# Patient Record
Sex: Male | Born: 1965 | Race: Black or African American | Hispanic: No | Marital: Single
Health system: Southern US, Community
[De-identification: ages and names within clinical notes are randomized; demographics above are authoritative.]

---

## 2009-01-23 ENCOUNTER — Ambulatory Visit: Payer: Self-pay | Admitting: Internal Medicine

## 2009-03-25 ENCOUNTER — Ambulatory Visit: Payer: Self-pay | Admitting: Internal Medicine

## 2009-03-25 ENCOUNTER — Ambulatory Visit (HOSPITAL_COMMUNITY): Admission: RE | Admit: 2009-03-25 | Discharge: 2009-03-25 | Payer: Self-pay | Admitting: Family Medicine

## 2009-04-23 ENCOUNTER — Encounter: Payer: Self-pay | Admitting: Family Medicine

## 2009-04-23 ENCOUNTER — Ambulatory Visit: Payer: Self-pay | Admitting: Internal Medicine

## 2009-04-23 LAB — CONVERTED CEMR LAB
ALT: 13 units/L (ref 0–53)
Alkaline Phosphatase: 93 units/L (ref 39–117)
Barbiturate Quant, Ur: NEGATIVE
Basophils Relative: 0 % (ref 0–1)
CO2: 23 meq/L (ref 19–32)
Calcium: 9.4 mg/dL (ref 8.4–10.5)
Creatinine, Ser: 1.05 mg/dL (ref 0.40–1.50)
Creatinine,U: 110.3 mg/dL
Glucose, Bld: 74 mg/dL (ref 70–99)
HCT: 47.6 % (ref 39.0–52.0)
Hemoglobin: 16 g/dL (ref 13.0–17.0)
Lymphs Abs: 2.2 10*3/uL (ref 0.7–4.0)
Monocytes Relative: 7 % (ref 3–12)
Neutro Abs: 3.4 10*3/uL (ref 1.7–7.7)
Opiate Screen, Urine: NEGATIVE
Platelets: 197 10*3/uL (ref 150–400)
Potassium: 5.2 meq/L (ref 3.5–5.3)
Propoxyphene: NEGATIVE
Sodium: 137 meq/L (ref 135–145)
Total Bilirubin: 0.7 mg/dL (ref 0.3–1.2)
Total CHOL/HDL Ratio: 3.1
Total Protein: 6.9 g/dL (ref 6.0–8.3)
Triglycerides: 49 mg/dL (ref <150)
WBC: 6.1 10*3/uL (ref 4.0–10.5)

## 2009-05-06 ENCOUNTER — Encounter (INDEPENDENT_AMBULATORY_CARE_PROVIDER_SITE_OTHER): Payer: Self-pay | Admitting: *Deleted

## 2009-05-20 ENCOUNTER — Ambulatory Visit: Payer: Self-pay | Admitting: Internal Medicine

## 2009-05-27 ENCOUNTER — Ambulatory Visit: Payer: Self-pay | Admitting: Internal Medicine

## 2009-11-25 ENCOUNTER — Emergency Department (HOSPITAL_COMMUNITY): Admission: EM | Admit: 2009-11-25 | Discharge: 2009-11-25 | Payer: Self-pay | Admitting: Emergency Medicine

## 2010-07-29 ENCOUNTER — Encounter (INDEPENDENT_AMBULATORY_CARE_PROVIDER_SITE_OTHER): Payer: Self-pay | Admitting: *Deleted

## 2010-10-26 LAB — BASIC METABOLIC PANEL
BUN: 11 mg/dL (ref 6–23)
CO2: 22 mEq/L (ref 19–32)
Calcium: 8.5 mg/dL (ref 8.4–10.5)
Creatinine, Ser: 1.06 mg/dL (ref 0.4–1.5)
GFR calc Af Amer: >60 mL/min (ref >60)
Glucose, Bld: 89 mg/dL (ref 70–99)
Potassium: 3.9 mEq/L (ref 3.5–5.1)
Sodium: 137 mEq/L (ref 135–145)

## 2010-10-26 LAB — CBC
Hemoglobin: 15.4 g/dL (ref 13.0–17.0)
MCHC: 34.6 g/dL (ref 30.0–36.0)
Platelets: 169 10*3/uL (ref 150–400)
RDW: 13.8 % (ref 11.5–15.5)
WBC: 9 10*3/uL (ref 4.0–10.5)

## 2010-10-26 LAB — URINALYSIS, ROUTINE W REFLEX MICROSCOPIC
Bilirubin Urine: NEGATIVE
Hgb urine dipstick: NEGATIVE
Protein, ur: NEGATIVE mg/dL

## 2010-10-26 LAB — DIFFERENTIAL
Basophils Absolute: 0.1 10*3/uL (ref 0.0–0.1)
Basophils Relative: 1 % (ref 0–1)
Eosinophils Absolute: 0.1 10*3/uL (ref 0.0–0.7)
Lymphocytes Relative: 15 % (ref 12–46)
Monocytes Relative: 7 % (ref 3–12)

## 2010-10-27 ENCOUNTER — Emergency Department (HOSPITAL_COMMUNITY): Payer: No Typology Code available for payment source

## 2010-10-27 ENCOUNTER — Emergency Department (HOSPITAL_COMMUNITY)
Admission: EM | Admit: 2010-10-27 | Discharge: 2010-10-27 | Disposition: A | Discharge status: Home / Self Care | Payer: No Typology Code available for payment source | Attending: Emergency Medicine | Admitting: Emergency Medicine

## 2010-10-27 DIAGNOSIS — S139XXA Sprain of joints and ligaments of unspecified parts of neck, initial encounter: Secondary | ICD-10-CM | POA: Insufficient documentation

## 2010-10-27 DIAGNOSIS — M542 Cervicalgia: Secondary | ICD-10-CM | POA: Insufficient documentation

## 2010-12-29 ENCOUNTER — Emergency Department (HOSPITAL_COMMUNITY)
Admission: EM | Admit: 2010-12-29 | Discharge: 2010-12-29 | Discharge status: Against Medical Advice | Payer: No Typology Code available for payment source | Attending: Emergency Medicine | Admitting: Emergency Medicine

## 2010-12-29 DIAGNOSIS — M549 Dorsalgia, unspecified: Secondary | ICD-10-CM | POA: Insufficient documentation

## 2010-12-30 ENCOUNTER — Emergency Department (HOSPITAL_COMMUNITY)
Admission: EM | Admit: 2010-12-30 | Discharge: 2010-12-30 | Disposition: A | Discharge status: Home / Self Care | Payer: No Typology Code available for payment source | Attending: Emergency Medicine | Admitting: Emergency Medicine

## 2010-12-30 DIAGNOSIS — M546 Pain in thoracic spine: Secondary | ICD-10-CM | POA: Insufficient documentation

## 2011-02-09 ENCOUNTER — Inpatient Hospital Stay (INDEPENDENT_AMBULATORY_CARE_PROVIDER_SITE_OTHER)
Admission: RE | Admit: 2011-02-09 | Discharge: 2011-02-09 | Disposition: A | Discharge status: Home / Self Care | Payer: No Typology Code available for payment source | Source: Ambulatory Visit | Attending: Emergency Medicine | Admitting: Emergency Medicine

## 2011-02-09 DIAGNOSIS — S058X9A Other injuries of unspecified eye and orbit, initial encounter: Secondary | ICD-10-CM

## 2011-04-25 ENCOUNTER — Ambulatory Visit (INDEPENDENT_AMBULATORY_CARE_PROVIDER_SITE_OTHER): Payer: No Typology Code available for payment source

## 2011-04-25 ENCOUNTER — Inpatient Hospital Stay (INDEPENDENT_AMBULATORY_CARE_PROVIDER_SITE_OTHER)
Admission: RE | Admit: 2011-04-25 | Discharge: 2011-04-25 | Disposition: A | Discharge status: Home / Self Care | Payer: BC Managed Care – PPO | Source: Ambulatory Visit | Attending: Emergency Medicine | Admitting: Emergency Medicine

## 2011-04-25 DIAGNOSIS — S90129A Contusion of unspecified lesser toe(s) without damage to nail, initial encounter: Secondary | ICD-10-CM
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
## 2016-03-30 DIAGNOSIS — F32A Depression, unspecified: Secondary | ICD-10-CM

## 2016-03-30 DIAGNOSIS — F329 Major depressive disorder, single episode, unspecified: Secondary | ICD-10-CM

## 2016-04-04 NOTE — Congregational Nurse Program (Signed)
Congregational Nurse Program Note  Date of Encounter: 03/30/2016  Past Medical History: No past medical history on file.  Encounter Details:     CNP Questionnaire - 03/30/16 1513      Patient Demographics   Is this a new or existing patient? New   Patient is considered a/an Not Applicable   Race African-American/Black     Patient Assistance   Location of Patient Assistance Not Applicable   Patient's financial/insurance status Low Income;[employer]   Uninsured Patient No   Patient referred to apply for the following financial assistance Not Applicable   Food insecurities addressed Not Applicable   Transportation assistance No   Assistance securing medications No   Educational health offerings Navigating the healthcare system;Behavioral health     Encounter Details   Primary purpose of visit Navigating the Healthcare System;Education/Health Concerns   Was an Emergency Department visit averted? Not Applicable   Does patient have a medical provider? Yes   Patient referred to Not Applicable   Was a mental health screening completed? (GAINS tool) Yes   Was a mental health referral made? No   Does patient have dental issues? No   Does patient have vision issues? No   Does your patient have an abnormal blood pressure today? No   Since previous encounter, have you referred patient for abnormal blood pressure that resulted in a new diagnosis or medication change? No   Does your patient have an abnormal blood glucose today? No   Since previous encounter, have you referred patient for abnormal blood glucose that resulted in a new diagnosis or medication change? No   Was there a life-saving intervention made? No      States is depressed.  Patient Health Questionaire -9 completed.  Score 20.  No suicidality noted.  Has never been seen for mental health.  Discussed with him need for a MH assessment and possible treatment.  [facility]
